# Patient Record
Sex: Male | Born: 1965 | Race: White | Hispanic: No | Marital: Single | Smoking: Never smoker
Health system: Southern US, Community
[De-identification: ages and names within clinical notes are randomized; demographics above are authoritative.]

## PROBLEM LIST (undated history)

## (undated) HISTORY — PX: RIB FRACTURE SURGERY: SHX2358

## (undated) HISTORY — PX: SPLENECTOMY: SUR1306

---

## 2008-11-19 ENCOUNTER — Ambulatory Visit: Payer: Self-pay | Admitting: Critical Care Medicine

## 2008-11-19 ENCOUNTER — Inpatient Hospital Stay (HOSPITAL_COMMUNITY): Admission: AC | Admit: 2008-11-19 | Discharge: 2008-12-15 | Payer: Self-pay

## 2008-11-19 ENCOUNTER — Encounter (INDEPENDENT_AMBULATORY_CARE_PROVIDER_SITE_OTHER): Payer: Self-pay | Admitting: General Surgery

## 2008-11-22 ENCOUNTER — Ambulatory Visit: Payer: Self-pay | Admitting: Cardiothoracic Surgery

## 2008-12-13 ENCOUNTER — Ambulatory Visit: Payer: Self-pay | Admitting: Physical Medicine & Rehabilitation

## 2008-12-30 ENCOUNTER — Ambulatory Visit: Payer: Self-pay | Admitting: Cardiothoracic Surgery

## 2008-12-30 ENCOUNTER — Encounter: Admission: RE | Admit: 2008-12-30 | Discharge: 2008-12-30 | Payer: Self-pay | Admitting: Cardiothoracic Surgery

## 2010-03-05 ENCOUNTER — Encounter: Payer: Self-pay | Admitting: Cardiothoracic Surgery

## 2010-05-17 LAB — CBC
HCT: 31 % — ABNORMAL LOW (ref 39.0–52.0)
MCHC: 33.7 g/dL (ref 30.0–36.0)
MCV: 93.7 fL (ref 78.0–100.0)
Platelets: 1178 10*3/uL (ref 150–400)
RDW: 16.5 % — ABNORMAL HIGH (ref 11.5–15.5)
WBC: 22.5 10*3/uL — ABNORMAL HIGH (ref 4.0–10.5)

## 2010-05-17 LAB — RETICULOCYTES: Retic Ct Pct: 4.5 % — ABNORMAL HIGH (ref 0.4–3.1)

## 2010-05-18 LAB — COMPREHENSIVE METABOLIC PANEL
ALT: 101 U/L — ABNORMAL HIGH (ref 0–53)
ALT: 26 U/L (ref 0–53)
ALT: 75 U/L — ABNORMAL HIGH (ref 0–53)
ALT: 22 U/L (ref 0–53)
ALT: 25 U/L (ref 0–53)
AST: 167 U/L — ABNORMAL HIGH (ref 0–37)
AST: 36 U/L (ref 0–37)
AST: 83 U/L — ABNORMAL HIGH (ref 0–37)
AST: 146 U/L — ABNORMAL HIGH (ref 0–37)
AST: 29 U/L (ref 0–37)
Albumin: 1.6 g/dL — ABNORMAL LOW (ref 3.5–5.2)
Albumin: 1.7 g/dL — ABNORMAL LOW (ref 3.5–5.2)
Albumin: 1.6 g/dL — ABNORMAL LOW (ref 3.5–5.2)
Albumin: 2.2 g/dL — ABNORMAL LOW (ref 3.5–5.2)
Albumin: 2.3 g/dL — ABNORMAL LOW (ref 3.5–5.2)
Alkaline Phosphatase: 130 U/L — ABNORMAL HIGH (ref 39–117)
Alkaline Phosphatase: 190 U/L — ABNORMAL HIGH (ref 39–117)
Alkaline Phosphatase: 34 U/L — ABNORMAL LOW (ref 39–117)
BUN: 23 mg/dL (ref 6–23)
BUN: 18 mg/dL (ref 6–23)
BUN: 18 mg/dL (ref 6–23)
CO2: 22 mEq/L (ref 19–32)
CO2: 31 mEq/L (ref 19–32)
Calcium: 7.6 mg/dL — ABNORMAL LOW (ref 8.4–10.5)
Calcium: 7.9 mg/dL — ABNORMAL LOW (ref 8.4–10.5)
Calcium: 7 mg/dL — ABNORMAL LOW (ref 8.4–10.5)
Calcium: 7.2 mg/dL — ABNORMAL LOW (ref 8.4–10.5)
Calcium: 7.9 mg/dL — ABNORMAL LOW (ref 8.4–10.5)
Chloride: 102 mEq/L (ref 96–112)
Chloride: 104 mEq/L (ref 96–112)
Chloride: 107 mEq/L (ref 96–112)
Chloride: 116 mEq/L — ABNORMAL HIGH (ref 96–112)
Chloride: 116 mEq/L — ABNORMAL HIGH (ref 96–112)
Creatinine, Ser: 0.55 mg/dL (ref 0.4–1.5)
Creatinine, Ser: 0.98 mg/dL (ref 0.4–1.5)
Creatinine, Ser: 1.08 mg/dL (ref 0.4–1.5)
GFR calc Af Amer: >60 mL/min (ref >60)
GFR calc Af Amer: >60 mL/min (ref >60)
GFR calc Af Amer: >60 mL/min (ref >60)
GFR calc Af Amer: >60 mL/min (ref >60)
GFR calc non Af Amer: >60 mL/min (ref >60)
GFR calc non Af Amer: >60 mL/min (ref >60)
GFR calc non Af Amer: >60 mL/min (ref >60)
Glucose, Bld: 109 mg/dL — ABNORMAL HIGH (ref 70–99)
Glucose, Bld: 119 mg/dL — ABNORMAL HIGH (ref 70–99)
Glucose, Bld: 126 mg/dL — ABNORMAL HIGH (ref 70–99)
Glucose, Bld: 136 mg/dL — ABNORMAL HIGH (ref 70–99)
Potassium: 3.8 mEq/L (ref 3.5–5.1)
Potassium: 4 mEq/L (ref 3.5–5.1)
Potassium: 3.6 mEq/L (ref 3.5–5.1)
Sodium: 134 mEq/L — ABNORMAL LOW (ref 135–145)
Sodium: 138 mEq/L (ref 135–145)
Sodium: 140 mEq/L (ref 135–145)
Sodium: 142 mEq/L (ref 135–145)
Sodium: 143 mEq/L (ref 135–145)
Total Bilirubin: 0.7 mg/dL (ref 0.3–1.2)
Total Bilirubin: 0.9 mg/dL (ref 0.3–1.2)
Total Bilirubin: 0.5 mg/dL (ref 0.3–1.2)
Total Bilirubin: 0.5 mg/dL (ref 0.3–1.2)
Total Bilirubin: 0.8 mg/dL (ref 0.3–1.2)
Total Protein: 4.3 g/dL — ABNORMAL LOW (ref 6.0–8.3)
Total Protein: 5 g/dL — ABNORMAL LOW (ref 6.0–8.3)
Total Protein: 3.8 g/dL — ABNORMAL LOW (ref 6.0–8.3)
Total Protein: 4.1 g/dL — ABNORMAL LOW (ref 6.0–8.3)
Total Protein: 4.7 g/dL — ABNORMAL LOW (ref 6.0–8.3)

## 2010-05-18 LAB — CBC
HCT: 21 % — ABNORMAL LOW (ref 39.0–52.0)
HCT: 22.2 % — ABNORMAL LOW (ref 39.0–52.0)
HCT: 22.8 % — ABNORMAL LOW (ref 39.0–52.0)
HCT: 23.8 % — ABNORMAL LOW (ref 39.0–52.0)
HCT: 24 % — ABNORMAL LOW (ref 39.0–52.0)
HCT: 26.5 % — ABNORMAL LOW (ref 39.0–52.0)
HCT: 22.7 % — ABNORMAL LOW (ref 39.0–52.0)
HCT: 25.3 % — ABNORMAL LOW (ref 39.0–52.0)
HCT: 25.6 % — ABNORMAL LOW (ref 39.0–52.0)
HCT: 26 % — ABNORMAL LOW (ref 39.0–52.0)
HCT: 29.4 % — ABNORMAL LOW (ref 39.0–52.0)
HCT: 38 % — ABNORMAL LOW (ref 39.0–52.0)
Hemoglobin: 7.1 g/dL — CL (ref 13.0–17.0)
Hemoglobin: 7.5 g/dL — CL (ref 13.0–17.0)
Hemoglobin: 7.7 g/dL — CL (ref 13.0–17.0)
Hemoglobin: 7.7 g/dL — CL (ref 13.0–17.0)
Hemoglobin: 8 g/dL — ABNORMAL LOW (ref 13.0–17.0)
Hemoglobin: 8 g/dL — ABNORMAL LOW (ref 13.0–17.0)
Hemoglobin: 8.1 g/dL — ABNORMAL LOW (ref 13.0–17.0)
Hemoglobin: 8.1 g/dL — ABNORMAL LOW (ref 13.0–17.0)
Hemoglobin: 8.4 g/dL — ABNORMAL LOW (ref 13.0–17.0)
Hemoglobin: 8.9 g/dL — ABNORMAL LOW (ref 13.0–17.0)
Hemoglobin: 9.3 g/dL — ABNORMAL LOW (ref 13.0–17.0)
Hemoglobin: 10.9 g/dL — ABNORMAL LOW (ref 13.0–17.0)
Hemoglobin: 11.9 g/dL — ABNORMAL LOW (ref 13.0–17.0)
Hemoglobin: 12.4 g/dL — ABNORMAL LOW (ref 13.0–17.0)
Hemoglobin: 7.8 g/dL — CL (ref 13.0–17.0)
Hemoglobin: 8.5 g/dL — ABNORMAL LOW (ref 13.0–17.0)
Hemoglobin: 8.5 g/dL — ABNORMAL LOW (ref 13.0–17.0)
Hemoglobin: 8.5 g/dL — ABNORMAL LOW (ref 13.0–17.0)
MCHC: 33.3 g/dL (ref 30.0–36.0)
MCHC: 33.5 g/dL (ref 30.0–36.0)
MCHC: 33.6 g/dL (ref 30.0–36.0)
MCHC: 33.8 g/dL (ref 30.0–36.0)
MCHC: 33.8 g/dL (ref 30.0–36.0)
MCHC: 33.8 g/dL (ref 30.0–36.0)
MCHC: 34 g/dL (ref 30.0–36.0)
MCHC: 34.1 g/dL (ref 30.0–36.0)
MCHC: 34.2 g/dL (ref 30.0–36.0)
MCHC: 33.4 g/dL (ref 30.0–36.0)
MCHC: 34.3 g/dL (ref 30.0–36.0)
MCHC: 34.6 g/dL (ref 30.0–36.0)
MCHC: 34.9 g/dL (ref 30.0–36.0)
MCHC: 35 g/dL (ref 30.0–36.0)
MCHC: 35 g/dL (ref 30.0–36.0)
MCHC: 35.3 g/dL (ref 30.0–36.0)
MCV: 90.5 fL (ref 78.0–100.0)
MCV: 90.9 fL (ref 78.0–100.0)
MCV: 91.3 fL (ref 78.0–100.0)
MCV: 91.3 fL (ref 78.0–100.0)
MCV: 91.7 fL (ref 78.0–100.0)
MCV: 91.9 fL (ref 78.0–100.0)
MCV: 92.3 fL (ref 78.0–100.0)
MCV: 92.3 fL (ref 78.0–100.0)
MCV: 92.7 fL (ref 78.0–100.0)
MCV: 92.8 fL (ref 78.0–100.0)
MCV: 89.6 fL (ref 78.0–100.0)
MCV: 90 fL (ref 78.0–100.0)
MCV: 90.1 fL (ref 78.0–100.0)
MCV: 90.5 fL (ref 78.0–100.0)
MCV: 90.8 fL (ref 78.0–100.0)
Platelets: 1141 10*3/uL (ref 150–400)
Platelets: 506 10*3/uL — ABNORMAL HIGH (ref 150–400)
Platelets: 550 10*3/uL — ABNORMAL HIGH (ref 150–400)
Platelets: 618 10*3/uL — ABNORMAL HIGH (ref 150–400)
Platelets: 659 10*3/uL — ABNORMAL HIGH (ref 150–400)
Platelets: 670 10*3/uL — ABNORMAL HIGH (ref 150–400)
Platelets: 725 10*3/uL — ABNORMAL HIGH (ref 150–400)
Platelets: 885 10*3/uL — ABNORMAL HIGH (ref 150–400)
Platelets: 968 10*3/uL (ref 150–400)
Platelets: 104 10*3/uL — ABNORMAL LOW (ref 150–400)
Platelets: 105 10*3/uL — ABNORMAL LOW (ref 150–400)
Platelets: 105 10*3/uL — ABNORMAL LOW (ref 150–400)
Platelets: 112 10*3/uL — ABNORMAL LOW (ref 150–400)
Platelets: 161 10*3/uL (ref 150–400)
RBC: 2.29 MIL/uL — ABNORMAL LOW (ref 4.22–5.81)
RBC: 2.4 MIL/uL — ABNORMAL LOW (ref 4.22–5.81)
RBC: 2.5 MIL/uL — ABNORMAL LOW (ref 4.22–5.81)
RBC: 2.5 MIL/uL — ABNORMAL LOW (ref 4.22–5.81)
RBC: 2.58 MIL/uL — ABNORMAL LOW (ref 4.22–5.81)
RBC: 2.59 MIL/uL — ABNORMAL LOW (ref 4.22–5.81)
RBC: 2.61 MIL/uL — ABNORMAL LOW (ref 4.22–5.81)
RBC: 2.62 MIL/uL — ABNORMAL LOW (ref 4.22–5.81)
RBC: 2.64 MIL/uL — ABNORMAL LOW (ref 4.22–5.81)
RBC: 2.71 MIL/uL — ABNORMAL LOW (ref 4.22–5.81)
RBC: 2.97 MIL/uL — ABNORMAL LOW (ref 4.22–5.81)
RBC: 2.71 MIL/uL — ABNORMAL LOW (ref 4.22–5.81)
RBC: 2.88 MIL/uL — ABNORMAL LOW (ref 4.22–5.81)
RBC: 3.26 MIL/uL — ABNORMAL LOW (ref 4.22–5.81)
RBC: 3.93 MIL/uL — ABNORMAL LOW (ref 4.22–5.81)
RDW: 15.6 % — ABNORMAL HIGH (ref 11.5–15.5)
RDW: 15.6 % — ABNORMAL HIGH (ref 11.5–15.5)
RDW: 15.8 % — ABNORMAL HIGH (ref 11.5–15.5)
RDW: 15.8 % — ABNORMAL HIGH (ref 11.5–15.5)
RDW: 16.1 % — ABNORMAL HIGH (ref 11.5–15.5)
RDW: 16.2 % — ABNORMAL HIGH (ref 11.5–15.5)
RDW: 16.2 % — ABNORMAL HIGH (ref 11.5–15.5)
RDW: 16.3 % — ABNORMAL HIGH (ref 11.5–15.5)
RDW: 16.5 % — ABNORMAL HIGH (ref 11.5–15.5)
RDW: 16.3 % — ABNORMAL HIGH (ref 11.5–15.5)
RDW: 16.4 % — ABNORMAL HIGH (ref 11.5–15.5)
RDW: 16.5 % — ABNORMAL HIGH (ref 11.5–15.5)
RDW: 16.5 % — ABNORMAL HIGH (ref 11.5–15.5)
RDW: 16.7 % — ABNORMAL HIGH (ref 11.5–15.5)
RDW: 17 % — ABNORMAL HIGH (ref 11.5–15.5)
RDW: 17.5 % — ABNORMAL HIGH (ref 11.5–15.5)
WBC: 13.1 10*3/uL — ABNORMAL HIGH (ref 4.0–10.5)
WBC: 15.2 10*3/uL — ABNORMAL HIGH (ref 4.0–10.5)
WBC: 17.3 10*3/uL — ABNORMAL HIGH (ref 4.0–10.5)
WBC: 17.8 10*3/uL — ABNORMAL HIGH (ref 4.0–10.5)
WBC: 18.4 10*3/uL — ABNORMAL HIGH (ref 4.0–10.5)
WBC: 22.3 10*3/uL — ABNORMAL HIGH (ref 4.0–10.5)
WBC: 26.6 10*3/uL — ABNORMAL HIGH (ref 4.0–10.5)
WBC: 11.8 10*3/uL — ABNORMAL HIGH (ref 4.0–10.5)
WBC: 12.8 10*3/uL — ABNORMAL HIGH (ref 4.0–10.5)
WBC: 13.2 10*3/uL — ABNORMAL HIGH (ref 4.0–10.5)
WBC: 17.8 10*3/uL — ABNORMAL HIGH (ref 4.0–10.5)

## 2010-05-18 LAB — POCT I-STAT 3, ART BLOOD GAS (G3+)
Acid-Base Excess: 5 mmol/L — ABNORMAL HIGH (ref 0.0–2.0)
Acid-Base Excess: 6 mmol/L — ABNORMAL HIGH (ref 0.0–2.0)
Acid-Base Excess: 10 mmol/L — ABNORMAL HIGH (ref 0.0–2.0)
Acid-Base Excess: 3 mmol/L — ABNORMAL HIGH (ref 0.0–2.0)
Acid-Base Excess: 4 mmol/L — ABNORMAL HIGH (ref 0.0–2.0)
Acid-Base Excess: 8 mmol/L — ABNORMAL HIGH (ref 0.0–2.0)
Acid-base deficit: 1 mmol/L (ref 0.0–2.0)
Acid-base deficit: 3 mmol/L — ABNORMAL HIGH (ref 0.0–2.0)
Acid-base deficit: 6 mmol/L — ABNORMAL HIGH (ref 0.0–2.0)
Acid-base deficit: 7 mmol/L — ABNORMAL HIGH (ref 0.0–2.0)
Acid-base deficit: 9 mmol/L — ABNORMAL HIGH (ref 0.0–2.0)
Bicarbonate: 28.1 meq/L — ABNORMAL HIGH (ref 20.0–24.0)
Bicarbonate: 17.4 meq/L — ABNORMAL LOW (ref 20.0–24.0)
Bicarbonate: 29.8 meq/L — ABNORMAL HIGH (ref 20.0–24.0)
Bicarbonate: 29.9 meq/L — ABNORMAL HIGH (ref 20.0–24.0)
Bicarbonate: 36.5 meq/L — ABNORMAL HIGH (ref 20.0–24.0)
Bicarbonate: 36.8 meq/L — ABNORMAL HIGH (ref 20.0–24.0)
O2 Saturation: 94 %
O2 Saturation: 94 %
O2 Saturation: 99 %
O2 Saturation: 55 %
O2 Saturation: 88 %
O2 Saturation: 89 %
O2 Saturation: 91 %
O2 Saturation: 93 %
O2 Saturation: 93 %
O2 Saturation: 93 %
O2 Saturation: 93 %
O2 Saturation: 94 %
O2 Saturation: 95 %
O2 Saturation: 97 %
O2 Saturation: 97 %
O2 Saturation: 98 %
O2 Saturation: 99 %
Patient temperature: 102
Patient temperature: 98.5
Patient temperature: 98.6
Patient temperature: 101.2
Patient temperature: 98
Patient temperature: 98.5
Patient temperature: 98.5
Patient temperature: 98.6
Patient temperature: 98.6
Patient temperature: 99.7
Patient temperature: 99.7
TCO2: 31 mmol/L (ref 0–100)
TCO2: 19 mmol/L (ref 0–100)
TCO2: 23 mmol/L (ref 0–100)
TCO2: 28 mmol/L (ref 0–100)
TCO2: 31 mmol/L (ref 0–100)
TCO2: 31 mmol/L (ref 0–100)
TCO2: 35 mmol/L (ref 0–100)
TCO2: 38 mmol/L (ref 0–100)
TCO2: 38 mmol/L (ref 0–100)
TCO2: 39 mmol/L (ref 0–100)
TCO2: 40 mmol/L (ref 0–100)
pCO2 arterial: 39.9 mmHg (ref 35.0–45.0)
pCO2 arterial: 44.2 mmHg (ref 35.0–45.0)
pCO2 arterial: 27 mmHg — ABNORMAL LOW (ref 35.0–45.0)
pCO2 arterial: 27.1 mmHg — ABNORMAL LOW (ref 35.0–45.0)
pCO2 arterial: 34.6 mmHg — ABNORMAL LOW (ref 35.0–45.0)
pCO2 arterial: 37 mmHg (ref 35.0–45.0)
pCO2 arterial: 46.5 mmHg — ABNORMAL HIGH (ref 35.0–45.0)
pCO2 arterial: 47.1 mmHg — ABNORMAL HIGH (ref 35.0–45.0)
pCO2 arterial: 48.3 mmHg — ABNORMAL HIGH (ref 35.0–45.0)
pCO2 arterial: 49.3 mmHg — ABNORMAL HIGH (ref 35.0–45.0)
pCO2 arterial: 50 mmHg — ABNORMAL HIGH (ref 35.0–45.0)
pCO2 arterial: 53.4 mmHg — ABNORMAL HIGH (ref 35.0–45.0)
pCO2 arterial: 61 mmHg (ref 35.0–45.0)
pCO2 arterial: 62.5 mmHg (ref 35.0–45.0)
pH, Arterial: 7.453 — ABNORMAL HIGH (ref 7.350–7.450)
pH, Arterial: 7.459 — ABNORMAL HIGH (ref 7.350–7.450)
pH, Arterial: 7.488 — ABNORMAL HIGH (ref 7.350–7.450)
pH, Arterial: 7.057 — CL (ref 7.350–7.450)
pH, Arterial: 7.265 — ABNORMAL LOW (ref 7.350–7.450)
pH, Arterial: 7.267 — ABNORMAL LOW (ref 7.350–7.450)
pH, Arterial: 7.352 (ref 7.350–7.450)
pH, Arterial: 7.385 (ref 7.350–7.450)
pH, Arterial: 7.391 (ref 7.350–7.450)
pH, Arterial: 7.395 (ref 7.350–7.450)
pH, Arterial: 7.398 (ref 7.350–7.450)
pH, Arterial: 7.4 (ref 7.350–7.450)
pH, Arterial: 7.47 — ABNORMAL HIGH (ref 7.350–7.450)
pH, Arterial: 7.511 — ABNORMAL HIGH (ref 7.350–7.450)
pO2, Arterial: 99 mmHg (ref 80.0–100.0)
pO2, Arterial: 116 mmHg — ABNORMAL HIGH (ref 80.0–100.0)
pO2, Arterial: 123 mmHg — ABNORMAL HIGH (ref 80.0–100.0)
pO2, Arterial: 140 mmHg — ABNORMAL HIGH (ref 80.0–100.0)
pO2, Arterial: 29 mmHg — CL (ref 80.0–100.0)
pO2, Arterial: 58 mmHg — ABNORMAL LOW (ref 80.0–100.0)
pO2, Arterial: 61 mmHg — ABNORMAL LOW (ref 80.0–100.0)
pO2, Arterial: 61 mmHg — ABNORMAL LOW (ref 80.0–100.0)
pO2, Arterial: 62 mmHg — ABNORMAL LOW (ref 80.0–100.0)
pO2, Arterial: 63 mmHg — ABNORMAL LOW (ref 80.0–100.0)
pO2, Arterial: 64 mmHg — ABNORMAL LOW (ref 80.0–100.0)
pO2, Arterial: 66 mmHg — ABNORMAL LOW (ref 80.0–100.0)
pO2, Arterial: 70 mmHg — ABNORMAL LOW (ref 80.0–100.0)

## 2010-05-18 LAB — GLUCOSE, CAPILLARY
Glucose-Capillary: 100 mg/dL — ABNORMAL HIGH (ref 70–99)
Glucose-Capillary: 100 mg/dL — ABNORMAL HIGH (ref 70–99)
Glucose-Capillary: 101 mg/dL — ABNORMAL HIGH (ref 70–99)
Glucose-Capillary: 103 mg/dL — ABNORMAL HIGH (ref 70–99)
Glucose-Capillary: 103 mg/dL — ABNORMAL HIGH (ref 70–99)
Glucose-Capillary: 104 mg/dL — ABNORMAL HIGH (ref 70–99)
Glucose-Capillary: 104 mg/dL — ABNORMAL HIGH (ref 70–99)
Glucose-Capillary: 105 mg/dL — ABNORMAL HIGH (ref 70–99)
Glucose-Capillary: 106 mg/dL — ABNORMAL HIGH (ref 70–99)
Glucose-Capillary: 107 mg/dL — ABNORMAL HIGH (ref 70–99)
Glucose-Capillary: 108 mg/dL — ABNORMAL HIGH (ref 70–99)
Glucose-Capillary: 109 mg/dL — ABNORMAL HIGH (ref 70–99)
Glucose-Capillary: 110 mg/dL — ABNORMAL HIGH (ref 70–99)
Glucose-Capillary: 112 mg/dL — ABNORMAL HIGH (ref 70–99)
Glucose-Capillary: 113 mg/dL — ABNORMAL HIGH (ref 70–99)
Glucose-Capillary: 114 mg/dL — ABNORMAL HIGH (ref 70–99)
Glucose-Capillary: 114 mg/dL — ABNORMAL HIGH (ref 70–99)
Glucose-Capillary: 116 mg/dL — ABNORMAL HIGH (ref 70–99)
Glucose-Capillary: 117 mg/dL — ABNORMAL HIGH (ref 70–99)
Glucose-Capillary: 119 mg/dL — ABNORMAL HIGH (ref 70–99)
Glucose-Capillary: 119 mg/dL — ABNORMAL HIGH (ref 70–99)
Glucose-Capillary: 120 mg/dL — ABNORMAL HIGH (ref 70–99)
Glucose-Capillary: 122 mg/dL — ABNORMAL HIGH (ref 70–99)
Glucose-Capillary: 123 mg/dL — ABNORMAL HIGH (ref 70–99)
Glucose-Capillary: 123 mg/dL — ABNORMAL HIGH (ref 70–99)
Glucose-Capillary: 124 mg/dL — ABNORMAL HIGH (ref 70–99)
Glucose-Capillary: 125 mg/dL — ABNORMAL HIGH (ref 70–99)
Glucose-Capillary: 136 mg/dL — ABNORMAL HIGH (ref 70–99)
Glucose-Capillary: 138 mg/dL — ABNORMAL HIGH (ref 70–99)
Glucose-Capillary: 148 mg/dL — ABNORMAL HIGH (ref 70–99)
Glucose-Capillary: 92 mg/dL (ref 70–99)
Glucose-Capillary: 92 mg/dL (ref 70–99)
Glucose-Capillary: 96 mg/dL (ref 70–99)
Glucose-Capillary: 98 mg/dL (ref 70–99)
Glucose-Capillary: 98 mg/dL (ref 70–99)
Glucose-Capillary: 98 mg/dL (ref 70–99)
Glucose-Capillary: 99 mg/dL (ref 70–99)
Glucose-Capillary: 102 mg/dL — ABNORMAL HIGH (ref 70–99)
Glucose-Capillary: 103 mg/dL — ABNORMAL HIGH (ref 70–99)
Glucose-Capillary: 106 mg/dL — ABNORMAL HIGH (ref 70–99)
Glucose-Capillary: 109 mg/dL — ABNORMAL HIGH (ref 70–99)
Glucose-Capillary: 113 mg/dL — ABNORMAL HIGH (ref 70–99)
Glucose-Capillary: 115 mg/dL — ABNORMAL HIGH (ref 70–99)
Glucose-Capillary: 116 mg/dL — ABNORMAL HIGH (ref 70–99)
Glucose-Capillary: 118 mg/dL — ABNORMAL HIGH (ref 70–99)
Glucose-Capillary: 119 mg/dL — ABNORMAL HIGH (ref 70–99)
Glucose-Capillary: 120 mg/dL — ABNORMAL HIGH (ref 70–99)
Glucose-Capillary: 124 mg/dL — ABNORMAL HIGH (ref 70–99)
Glucose-Capillary: 125 mg/dL — ABNORMAL HIGH (ref 70–99)
Glucose-Capillary: 128 mg/dL — ABNORMAL HIGH (ref 70–99)
Glucose-Capillary: 130 mg/dL — ABNORMAL HIGH (ref 70–99)
Glucose-Capillary: 68 mg/dL — ABNORMAL LOW (ref 70–99)
Glucose-Capillary: 72 mg/dL (ref 70–99)
Glucose-Capillary: 72 mg/dL (ref 70–99)
Glucose-Capillary: 76 mg/dL (ref 70–99)
Glucose-Capillary: 78 mg/dL (ref 70–99)
Glucose-Capillary: 81 mg/dL (ref 70–99)
Glucose-Capillary: 81 mg/dL (ref 70–99)
Glucose-Capillary: 92 mg/dL (ref 70–99)

## 2010-05-18 LAB — POCT I-STAT 7, (LYTES, BLD GAS, ICA,H+H)
Acid-base deficit: 5 mmol/L — ABNORMAL HIGH (ref 0.0–2.0)
Calcium, Ion: 0.94 mmol/L — ABNORMAL LOW (ref 1.12–1.32)
Hemoglobin: 9.5 g/dL — ABNORMAL LOW (ref 13.0–17.0)
O2 Saturation: 97 %
Potassium: 4 meq/L (ref 3.5–5.1)
Sodium: 140 meq/L (ref 135–145)
TCO2: 19 mmol/L (ref 0–100)
TCO2: 24 mmol/L (ref 0–100)
pCO2 arterial: 48.7 mmHg — ABNORMAL HIGH (ref 35.0–45.0)
pH, Arterial: 7.076 — CL (ref 7.350–7.450)
pO2, Arterial: 101 mmHg — ABNORMAL HIGH (ref 80.0–100.0)

## 2010-05-18 LAB — BLOOD GAS, ARTERIAL
Acid-Base Excess: 5.9 mmol/L — ABNORMAL HIGH (ref 0.0–2.0)
Bicarbonate: 30.3 mEq/L — ABNORMAL HIGH (ref 20.0–24.0)
FIO2: 0.6 %
MECHVT: 550 mL
MECHVT: 550 mL
O2 Saturation: 95.5 %
PEEP: 5 cmH2O
Patient temperature: 100
Patient temperature: 99.4
RATE: 12 resp/min
TCO2: 31.8 mmol/L (ref 0–100)
pCO2 arterial: 49.3 mmHg — ABNORMAL HIGH (ref 35.0–45.0)
pH, Arterial: 7.396 (ref 7.350–7.450)
pH, Arterial: 7.41 (ref 7.350–7.450)
pO2, Arterial: 82.6 mmHg (ref 80.0–100.0)

## 2010-05-18 LAB — CLOSTRIDIUM DIFFICILE EIA
C difficile Toxins A+B, EIA: NEGATIVE
C difficile Toxins A+B, EIA: NEGATIVE

## 2010-05-18 LAB — TSH
TSH: 1.438 u[IU]/mL (ref 0.350–4.500)
TSH: 7.887 u[IU]/mL — ABNORMAL HIGH (ref 0.350–4.500)

## 2010-05-18 LAB — TYPE AND SCREEN
ABO/RH(D): O POS
ABO/RH(D): O POS
Antibody Screen: NEGATIVE
Antibody Screen: NEGATIVE
Antibody Screen: NEGATIVE

## 2010-05-18 LAB — FERRITIN: Ferritin: 1319 ng/mL — ABNORMAL HIGH (ref 22–322)

## 2010-05-18 LAB — T4: T4, Total: 2.9 ug/dL — ABNORMAL LOW (ref 5.0–12.5)

## 2010-05-18 LAB — DIFFERENTIAL
Basophils Absolute: 0.2 10*3/uL — ABNORMAL HIGH (ref 0.0–0.1)
Basophils Absolute: 0.5 10*3/uL — ABNORMAL HIGH (ref 0.0–0.1)
Basophils Absolute: 0 10*3/uL (ref 0.0–0.1)
Basophils Absolute: 0 10*3/uL (ref 0.0–0.1)
Basophils Relative: 1 % (ref 0–1)
Basophils Relative: 2 % — ABNORMAL HIGH (ref 0–1)
Basophils Relative: 3 % — ABNORMAL HIGH (ref 0–1)
Basophils Relative: 7 % — ABNORMAL HIGH (ref 0–1)
Basophils Relative: 0 % (ref 0–1)
Basophils Relative: 0 % (ref 0–1)
Eosinophils Absolute: 0.1 10*3/uL (ref 0.0–0.7)
Eosinophils Absolute: 0.3 10*3/uL (ref 0.0–0.7)
Eosinophils Absolute: 0.3 10*3/uL (ref 0.0–0.7)
Eosinophils Absolute: 0.4 10*3/uL (ref 0.0–0.7)
Eosinophils Absolute: 0.5 10*3/uL (ref 0.0–0.7)
Eosinophils Absolute: 0 10*3/uL (ref 0.0–0.7)
Eosinophils Absolute: 0 10*3/uL (ref 0.0–0.7)
Eosinophils Absolute: 0.2 10*3/uL (ref 0.0–0.7)
Eosinophils Relative: 1 % (ref 0–5)
Eosinophils Relative: 2 % (ref 0–5)
Eosinophils Relative: 0 % (ref 0–5)
Eosinophils Relative: 0 % (ref 0–5)
Lymphs Abs: 1.6 10*3/uL (ref 0.7–4.0)
Lymphs Abs: 0.6 10*3/uL — ABNORMAL LOW (ref 0.7–4.0)
Monocytes Absolute: 1.2 10*3/uL — ABNORMAL HIGH (ref 0.1–1.0)
Monocytes Absolute: 2 10*3/uL — ABNORMAL HIGH (ref 0.1–1.0)
Monocytes Absolute: 2.3 10*3/uL — ABNORMAL HIGH (ref 0.1–1.0)
Monocytes Absolute: 0.7 10*3/uL (ref 0.1–1.0)
Monocytes Absolute: 1 10*3/uL (ref 0.1–1.0)
Monocytes Relative: 10 % (ref 3–12)
Monocytes Relative: 8 % (ref 3–12)
Monocytes Relative: 9 % (ref 3–12)
Monocytes Relative: 8 % (ref 3–12)
Neutrophils Relative %: 77 % (ref 43–77)
Neutrophils Relative %: 91 % — ABNORMAL HIGH (ref 43–77)
WBC Morphology: INCREASED

## 2010-05-18 LAB — BASIC METABOLIC PANEL
BUN: 16 mg/dL (ref 6–23)
BUN: 16 mg/dL (ref 6–23)
BUN: 22 mg/dL (ref 6–23)
BUN: 22 mg/dL (ref 6–23)
BUN: 22 mg/dL (ref 6–23)
BUN: 30 mg/dL — ABNORMAL HIGH (ref 6–23)
BUN: 11 mg/dL (ref 6–23)
BUN: 3 mg/dL — ABNORMAL LOW (ref 6–23)
BUN: 3 mg/dL — ABNORMAL LOW (ref 6–23)
CO2: 22 mEq/L (ref 19–32)
CO2: 23 mEq/L (ref 19–32)
CO2: 26 mEq/L (ref 19–32)
CO2: 29 mEq/L (ref 19–32)
CO2: 30 mEq/L (ref 19–32)
CO2: 33 mEq/L — ABNORMAL HIGH (ref 19–32)
CO2: 36 mEq/L — ABNORMAL HIGH (ref 19–32)
CO2: 17 mEq/L — ABNORMAL LOW (ref 19–32)
CO2: 25 mEq/L (ref 19–32)
CO2: 30 mEq/L (ref 19–32)
Calcium: 6.1 mg/dL — CL (ref 8.4–10.5)
Calcium: 7.6 mg/dL — ABNORMAL LOW (ref 8.4–10.5)
Calcium: 8.1 mg/dL — ABNORMAL LOW (ref 8.4–10.5)
Calcium: 8.1 mg/dL — ABNORMAL LOW (ref 8.4–10.5)
Calcium: 8.3 mg/dL — ABNORMAL LOW (ref 8.4–10.5)
Calcium: 6 mg/dL — CL (ref 8.4–10.5)
Calcium: 6.9 mg/dL — ABNORMAL LOW (ref 8.4–10.5)
Calcium: 7.4 mg/dL — ABNORMAL LOW (ref 8.4–10.5)
Calcium: 8.3 mg/dL — ABNORMAL LOW (ref 8.4–10.5)
Chloride: 103 mEq/L (ref 96–112)
Chloride: 104 mEq/L (ref 96–112)
Chloride: 104 mEq/L (ref 96–112)
Chloride: 106 mEq/L (ref 96–112)
Chloride: 113 mEq/L — ABNORMAL HIGH (ref 96–112)
Chloride: 113 mEq/L — ABNORMAL HIGH (ref 96–112)
Chloride: 99 mEq/L (ref 96–112)
Chloride: 109 mEq/L (ref 96–112)
Chloride: 116 mEq/L — ABNORMAL HIGH (ref 96–112)
Chloride: 117 mEq/L — ABNORMAL HIGH (ref 96–112)
Creatinine, Ser: 0.44 mg/dL (ref 0.4–1.5)
Creatinine, Ser: 0.44 mg/dL (ref 0.4–1.5)
Creatinine, Ser: 0.47 mg/dL (ref 0.4–1.5)
Creatinine, Ser: 0.49 mg/dL (ref 0.4–1.5)
Creatinine, Ser: 0.51 mg/dL (ref 0.4–1.5)
Creatinine, Ser: 0.59 mg/dL (ref 0.4–1.5)
Creatinine, Ser: 0.51 mg/dL (ref 0.4–1.5)
Creatinine, Ser: 0.52 mg/dL (ref 0.4–1.5)
Creatinine, Ser: 1.19 mg/dL (ref 0.4–1.5)
GFR calc Af Amer: >60 mL/min (ref >60)
GFR calc Af Amer: >60 mL/min (ref >60)
GFR calc Af Amer: >60 mL/min (ref >60)
GFR calc Af Amer: >60 mL/min (ref >60)
GFR calc Af Amer: >60 mL/min (ref >60)
GFR calc Af Amer: >60 mL/min (ref >60)
GFR calc Af Amer: >60 mL/min (ref >60)
GFR calc Af Amer: >60 mL/min (ref >60)
GFR calc Af Amer: >60 mL/min (ref >60)
GFR calc Af Amer: >60 mL/min (ref >60)
GFR calc non Af Amer: >60 mL/min (ref >60)
GFR calc non Af Amer: >60 mL/min (ref >60)
GFR calc non Af Amer: >60 mL/min (ref >60)
GFR calc non Af Amer: >60 mL/min (ref >60)
GFR calc non Af Amer: >60 mL/min (ref >60)
GFR calc non Af Amer: >60 mL/min (ref >60)
GFR calc non Af Amer: >60 mL/min (ref >60)
GFR calc non Af Amer: >60 mL/min (ref >60)
GFR calc non Af Amer: >60 mL/min (ref >60)
Glucose, Bld: 102 mg/dL — ABNORMAL HIGH (ref 70–99)
Glucose, Bld: 122 mg/dL — ABNORMAL HIGH (ref 70–99)
Glucose, Bld: 129 mg/dL — ABNORMAL HIGH (ref 70–99)
Glucose, Bld: 143 mg/dL — ABNORMAL HIGH (ref 70–99)
Glucose, Bld: 92 mg/dL (ref 70–99)
Glucose, Bld: 110 mg/dL — ABNORMAL HIGH (ref 70–99)
Glucose, Bld: 120 mg/dL — ABNORMAL HIGH (ref 70–99)
Glucose, Bld: 165 mg/dL — ABNORMAL HIGH (ref 70–99)
Glucose, Bld: 170 mg/dL — ABNORMAL HIGH (ref 70–99)
Potassium: 3.2 mEq/L — ABNORMAL LOW (ref 3.5–5.1)
Potassium: 3.5 mEq/L (ref 3.5–5.1)
Potassium: 3.8 mEq/L (ref 3.5–5.1)
Potassium: 4 mEq/L (ref 3.5–5.1)
Potassium: 4.1 mEq/L (ref 3.5–5.1)
Potassium: 2.6 mEq/L — CL (ref 3.5–5.1)
Potassium: 4.1 mEq/L (ref 3.5–5.1)
Potassium: 4.1 mEq/L (ref 3.5–5.1)
Sodium: 136 mEq/L (ref 135–145)
Sodium: 137 mEq/L (ref 135–145)
Sodium: 138 mEq/L (ref 135–145)
Sodium: 141 mEq/L (ref 135–145)
Sodium: 141 mEq/L (ref 135–145)
Sodium: 143 mEq/L (ref 135–145)
Sodium: 144 mEq/L (ref 135–145)
Sodium: 139 mEq/L (ref 135–145)
Sodium: 139 mEq/L (ref 135–145)
Sodium: 141 mEq/L (ref 135–145)
Sodium: 141 mEq/L (ref 135–145)
Sodium: 143 mEq/L (ref 135–145)

## 2010-05-18 LAB — POCT I-STAT 4, (NA,K, GLUC, HGB,HCT)
HCT: 33 % — ABNORMAL LOW (ref 39.0–52.0)
Sodium: 144 meq/L (ref 135–145)

## 2010-05-18 LAB — GRAM STAIN

## 2010-05-18 LAB — APTT
aPTT: 34 seconds (ref 24–37)
aPTT: 23 s — ABNORMAL LOW (ref 24–37)
aPTT: 24 seconds (ref 24–37)

## 2010-05-18 LAB — MAGNESIUM
Magnesium: 2.3 mg/dL (ref 1.5–2.5)
Magnesium: 1.9 mg/dL (ref 1.5–2.5)

## 2010-05-18 LAB — PROTIME-INR
INR: 1.26 (ref 0.00–1.49)
INR: 1.19 (ref 0.00–1.49)
INR: 1.28 (ref 0.00–1.49)
INR: 1.35 (ref 0.00–1.49)
INR: 1.41 (ref 0.00–1.49)
INR: 1.46 (ref 0.00–1.49)
Prothrombin Time: 15.7 seconds — ABNORMAL HIGH (ref 11.6–15.2)
Prothrombin Time: 15 s (ref 11.6–15.2)
Prothrombin Time: 15.9 seconds — ABNORMAL HIGH (ref 11.6–15.2)
Prothrombin Time: 17.2 seconds — ABNORMAL HIGH (ref 11.6–15.2)
Prothrombin Time: 17.6 seconds — ABNORMAL HIGH (ref 11.6–15.2)

## 2010-05-18 LAB — RETICULOCYTES: Retic Count, Absolute: 32 10*3/uL (ref 19.0–186.0)

## 2010-05-18 LAB — CULTURE, RESPIRATORY W GRAM STAIN

## 2010-05-18 LAB — RENAL FUNCTION PANEL
Calcium: 7 mg/dL — ABNORMAL LOW (ref 8.4–10.5)
GFR calc Af Amer: >60 mL/min (ref >60)
Glucose, Bld: 104 mg/dL — ABNORMAL HIGH (ref 70–99)
Phosphorus: 2.4 mg/dL (ref 2.3–4.6)
Sodium: 139 mEq/L (ref 135–145)

## 2010-05-18 LAB — POCT I-STAT, CHEM 8
Calcium, Ion: 0.92 mmol/L — ABNORMAL LOW (ref 1.12–1.32)
Chloride: 109 meq/L (ref 96–112)
Creatinine, Ser: 1 mg/dL (ref 0.4–1.5)
HCT: 33 % — ABNORMAL LOW (ref 39.0–52.0)
HCT: 35 % — ABNORMAL LOW (ref 39.0–52.0)
Hemoglobin: 11.2 g/dL — ABNORMAL LOW (ref 13.0–17.0)
Potassium: 3.7 meq/L (ref 3.5–5.1)
Potassium: 4.3 meq/L (ref 3.5–5.1)
Sodium: 140 meq/L (ref 135–145)
TCO2: 16 mmol/L (ref 0–100)

## 2010-05-18 LAB — IRON AND TIBC: Iron: <10 ug/dL — ABNORMAL LOW (ref 42–135)

## 2010-05-18 LAB — CULTURE, BLOOD (ROUTINE X 2)
Culture: NO GROWTH
Culture: NO GROWTH

## 2010-05-18 LAB — CULTURE, BAL-QUANTITATIVE W GRAM STAIN

## 2010-05-18 LAB — URINE MICROSCOPIC-ADD ON

## 2010-05-18 LAB — PHOSPHORUS: Phosphorus: 3.5 mg/dL (ref 2.3–4.6)

## 2010-05-18 LAB — CARDIAC PANEL(CRET KIN+CKTOT+MB+TROPI)
Total CK: 1517 U/L — ABNORMAL HIGH (ref 7–232)
Total CK: 2496 U/L — ABNORMAL HIGH (ref 7–232)
Troponin I: 0.03 ng/mL (ref 0.00–0.06)

## 2010-05-18 LAB — URINALYSIS, ROUTINE W REFLEX MICROSCOPIC
Glucose, UA: NEGATIVE mg/dL
Ketones, ur: NEGATIVE mg/dL
Protein, ur: NEGATIVE mg/dL

## 2010-05-18 LAB — ABO/RH: ABO/RH(D): O POS

## 2010-05-18 LAB — CATH TIP CULTURE

## 2010-05-18 LAB — HEMOGLOBIN AND HEMATOCRIT, BLOOD: Hemoglobin: 8.4 g/dL — ABNORMAL LOW (ref 13.0–17.0)

## 2010-05-18 LAB — VANCOMYCIN, TROUGH
Vancomycin Tr: 5.3 ug/mL — ABNORMAL LOW (ref 10.0–20.0)
Vancomycin Tr: 8.3 ug/mL — ABNORMAL LOW (ref 10.0–20.0)

## 2010-05-18 LAB — LACTIC ACID, PLASMA: Lactic Acid, Venous: 4.6 mmol/L — ABNORMAL HIGH (ref 0.5–2.2)

## 2010-05-18 LAB — T3: T3, Total: 35.8 ng/dl — ABNORMAL LOW (ref 80.0–204.0)

## 2010-05-18 LAB — PATHOLOGIST SMEAR REVIEW
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
## 2010-09-03 ENCOUNTER — Emergency Department (INDEPENDENT_AMBULATORY_CARE_PROVIDER_SITE_OTHER): Payer: Self-pay

## 2010-09-03 ENCOUNTER — Encounter: Payer: Self-pay | Admitting: *Deleted

## 2010-09-03 ENCOUNTER — Emergency Department (HOSPITAL_BASED_OUTPATIENT_CLINIC_OR_DEPARTMENT_OTHER)
Admission: EM | Admit: 2010-09-03 | Discharge: 2010-09-03 | Disposition: A | Discharge status: Home / Self Care | Payer: Self-pay | Attending: Emergency Medicine | Admitting: Emergency Medicine

## 2010-09-03 DIAGNOSIS — R05 Cough: Secondary | ICD-10-CM

## 2010-09-03 DIAGNOSIS — R1013 Epigastric pain: Secondary | ICD-10-CM | POA: Insufficient documentation

## 2010-09-03 DIAGNOSIS — R059 Cough, unspecified: Secondary | ICD-10-CM

## 2010-09-03 DIAGNOSIS — R0602 Shortness of breath: Secondary | ICD-10-CM

## 2010-09-03 DIAGNOSIS — R079 Chest pain, unspecified: Secondary | ICD-10-CM | POA: Insufficient documentation

## 2010-09-03 MED ORDER — OXYCODONE-ACETAMINOPHEN 5-325 MG PO TABS
1.0000 | ORAL_TABLET | Freq: Once | ORAL | Status: AC
  Administered 2010-09-03: 1 via ORAL
  Filled 2010-09-03: qty 1

## 2010-09-03 MED ORDER — HYDROCODONE-ACETAMINOPHEN 5-325 MG PO TABS: 1.0000 | ORAL_TABLET | ORAL | Status: AC | PRN
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
## 2010-11-02 IMAGING — CR DG CHEST 1V PORT
1 series · 1 of 1 positions shown · non-contrast
Comparison: Earlier the same day

CLINICAL DATA: PICC line placement.

PORTABLE CHEST - 1 VIEW

[view not recorded]
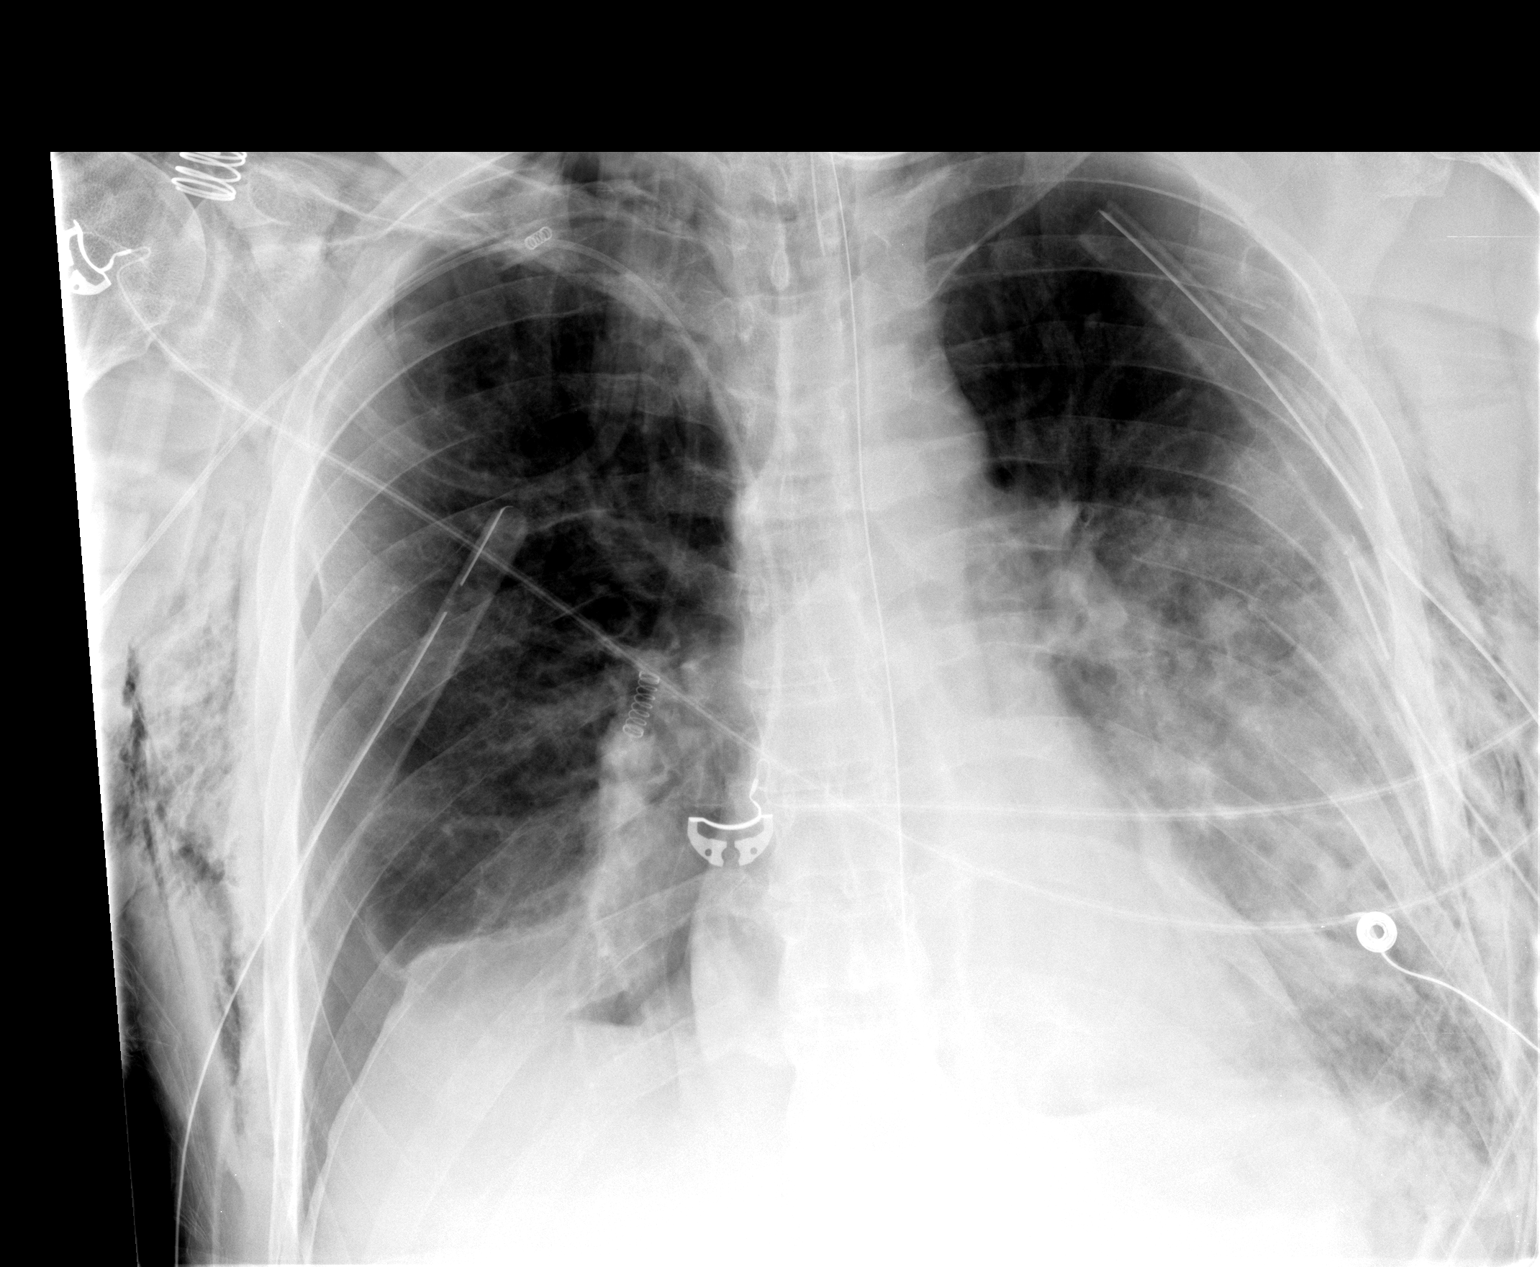

[1 of 1 positions shown; findings below may reference images not displayed]

FINDINGS: 7661 hours.  Endotracheal tube tip is 8.7 cm above the
base of the carina.  NG tube passes into the stomach although the
distal tip is not visualized.  A right PICC line is new in the
interval and the tip projects at the mid to distal SVC level.
Right chest tube remains in place with residual small right
pneumothorax.  The two left chest tubes persist.  Apical
pneumothorax seen on the film earlier today is less apparent on
this study.  Persistent bibasilar collapse /
consolidation/contusion.  Subcutaneous emphysema again noted.
IMPRESSION: The right PICC line tip projects at the mid to distal SVC level.

## 2012-07-10 DIAGNOSIS — K219 Gastro-esophageal reflux disease without esophagitis: Secondary | ICD-10-CM | POA: Insufficient documentation

## 2014-02-16 DIAGNOSIS — F331 Major depressive disorder, recurrent, moderate: Secondary | ICD-10-CM | POA: Insufficient documentation

## 2017-03-19 ENCOUNTER — Ambulatory Visit (HOSPITAL_COMMUNITY): Payer: Self-pay | Admitting: Licensed Clinical Social Worker

## 2017-03-20 ENCOUNTER — Ambulatory Visit (HOSPITAL_COMMUNITY): Payer: Self-pay | Admitting: Psychiatry

## 2018-03-13 ENCOUNTER — Ambulatory Visit (INDEPENDENT_AMBULATORY_CARE_PROVIDER_SITE_OTHER): Payer: Medicare Other | Admitting: Psychiatry

## 2018-03-13 ENCOUNTER — Other Ambulatory Visit: Payer: Self-pay

## 2018-03-13 ENCOUNTER — Encounter (HOSPITAL_COMMUNITY): Payer: Self-pay | Admitting: Psychiatry

## 2018-03-13 VITALS — BP 118/88 | HR 76 | Ht 72.0 in | Wt 153.0 lb

## 2018-03-13 DIAGNOSIS — F411 Generalized anxiety disorder: Secondary | ICD-10-CM | POA: Diagnosis not present

## 2018-03-13 DIAGNOSIS — F1021 Alcohol dependence, in remission: Secondary | ICD-10-CM | POA: Diagnosis not present

## 2018-03-13 DIAGNOSIS — F250 Schizoaffective disorder, bipolar type: Secondary | ICD-10-CM | POA: Diagnosis not present

## 2018-03-13 MED ORDER — ESCITALOPRAM OXALATE 10 MG PO TABS: 10.0000 mg | ORAL_TABLET | Freq: Every day | ORAL | 1 refills | Status: DC

## 2018-03-13 NOTE — Patient Instructions (Signed)
Avoid daytime seroquel if have to drive or drive any equipment He wants to continue 400mg  bid for now and understands the risk.   Will add lexapro 10mg  for anxiety and possible PTSD Stop trazadone at night since taking seroquel at night as well.
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
## 2018-04-04 ENCOUNTER — Other Ambulatory Visit (HOSPITAL_COMMUNITY): Payer: Self-pay | Admitting: Psychiatry

## 2018-04-07 ENCOUNTER — Ambulatory Visit (HOSPITAL_COMMUNITY): Payer: Medicare Other | Admitting: Psychiatry

## 2018-04-09 ENCOUNTER — Telehealth (HOSPITAL_COMMUNITY): Payer: Self-pay

## 2018-04-09 NOTE — Telephone Encounter (Signed)
[facility] called and stated the patient does not currently meet the criteria for [facility]T.

## 2018-04-11 ENCOUNTER — Ambulatory Visit (HOSPITAL_COMMUNITY): Payer: Medicare Other | Admitting: Psychiatry

## 2018-04-11 NOTE — Telephone Encounter (Signed)
Patient No Show today, would have preferred he be part of ACTT

## 2018-04-15 ENCOUNTER — Encounter (HOSPITAL_COMMUNITY): Payer: Self-pay | Admitting: Psychiatry

## 2018-04-15 ENCOUNTER — Other Ambulatory Visit: Payer: Self-pay

## 2018-04-15 ENCOUNTER — Ambulatory Visit (INDEPENDENT_AMBULATORY_CARE_PROVIDER_SITE_OTHER): Payer: Medicare Other | Admitting: Psychiatry

## 2018-04-15 VITALS — BP 112/72 | Ht 72.0 in | Wt 153.0 lb

## 2018-04-15 DIAGNOSIS — F411 Generalized anxiety disorder: Secondary | ICD-10-CM

## 2018-04-15 DIAGNOSIS — F250 Schizoaffective disorder, bipolar type: Secondary | ICD-10-CM | POA: Diagnosis not present

## 2018-04-15 DIAGNOSIS — F1021 Alcohol dependence, in remission: Secondary | ICD-10-CM

## 2018-04-15 MED ORDER — LAMOTRIGINE 100 MG PO TABS: 100.0000 mg | ORAL_TABLET | Freq: Every day | ORAL | 1 refills | Status: AC

## 2018-04-15 MED ORDER — ESCITALOPRAM OXALATE 20 MG PO TABS: 20.0000 mg | ORAL_TABLET | Freq: Every day | ORAL | 1 refills | Status: DC

## 2018-04-15 MED ORDER — QUETIAPINE FUMARATE 400 MG PO TABS: ORAL_TABLET | ORAL | 1 refills | Status: AC
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
## 2018-05-08 ENCOUNTER — Other Ambulatory Visit (HOSPITAL_COMMUNITY): Payer: Self-pay | Admitting: Psychiatry

## 2018-05-13 ENCOUNTER — Ambulatory Visit (HOSPITAL_COMMUNITY): Payer: Medicare Other | Admitting: Psychiatry

## 2018-05-13 ENCOUNTER — Other Ambulatory Visit: Payer: Self-pay

## 2018-06-12 ENCOUNTER — Other Ambulatory Visit (HOSPITAL_COMMUNITY): Payer: Self-pay | Admitting: Psychiatry

## 2018-07-13 ENCOUNTER — Other Ambulatory Visit (HOSPITAL_COMMUNITY): Payer: Self-pay | Admitting: Psychiatry

## 2018-07-22 ENCOUNTER — Other Ambulatory Visit (HOSPITAL_COMMUNITY): Payer: Self-pay | Admitting: Psychiatry

## 2018-07-28 ENCOUNTER — Other Ambulatory Visit (HOSPITAL_COMMUNITY): Payer: Self-pay | Admitting: Psychiatry

## 2018-08-18 ENCOUNTER — Other Ambulatory Visit (HOSPITAL_COMMUNITY): Payer: Self-pay | Admitting: Psychiatry

## 2022-01-25 ENCOUNTER — Ambulatory Visit: Payer: Self-pay | Admitting: Family Medicine

## 2022-03-19 ENCOUNTER — Ambulatory Visit: Payer: Self-pay | Admitting: Family Medicine

## 2022-09-18 ENCOUNTER — Ambulatory Visit: Payer: Medicare Other | Admitting: Family Medicine

## 2022-09-19 ENCOUNTER — Encounter: Payer: Self-pay | Admitting: General Practice
